# Patient Record
Sex: Male | Born: 1935 | Race: White | Hispanic: No | Marital: Married
Health system: Southern US, Community
[De-identification: ages and names within clinical notes are randomized; demographics above are authoritative.]

---

## 2014-01-11 ENCOUNTER — Observation Stay: Payer: Self-pay | Admitting: Internal Medicine

## 2014-01-11 LAB — COMPREHENSIVE METABOLIC PANEL
ALBUMIN: 3.4 g/dL (ref 3.4–5.0)
ANION GAP: 6 — AB (ref 7–16)
AST: 23 U/L (ref 15–37)
Alkaline Phosphatase: 78 U/L
BUN: 24 mg/dL — ABNORMAL HIGH (ref 7–18)
Bilirubin,Total: 0.3 mg/dL (ref 0.2–1.0)
CALCIUM: 8.6 mg/dL (ref 8.5–10.1)
Chloride: 104 mmol/L (ref 98–107)
Co2: 29 mmol/L (ref 21–32)
Creatinine: 1.33 mg/dL — ABNORMAL HIGH (ref 0.60–1.30)
EGFR (African American): 56 — ABNORMAL LOW
EGFR (Non-African Amer.): 48 — ABNORMAL LOW
Glucose: 152 mg/dL — ABNORMAL HIGH (ref 65–99)
Osmolality: 285 (ref 275–301)
Potassium: 4.9 mmol/L (ref 3.5–5.1)
SGPT (ALT): 14 U/L
Sodium: 139 mmol/L (ref 136–145)
TOTAL PROTEIN: 7.4 g/dL (ref 6.4–8.2)

## 2014-01-11 LAB — CBC
HCT: 37.1 % — ABNORMAL LOW (ref 40.0–52.0)
HGB: 12.2 g/dL — AB (ref 13.0–18.0)
MCH: 33.8 pg (ref 26.0–34.0)
MCHC: 32.9 g/dL (ref 32.0–36.0)
MCV: 103 fL — AB (ref 80–100)
PLATELETS: 192 10*3/uL (ref 150–440)
RBC: 3.61 10*6/uL — ABNORMAL LOW (ref 4.40–5.90)
RDW: 13 % (ref 11.5–14.5)
WBC: 7.7 10*3/uL (ref 3.8–10.6)

## 2014-01-11 LAB — TROPONIN I: Troponin-I: <0.02 ng/mL

## 2014-01-12 LAB — BASIC METABOLIC PANEL
Anion Gap: 5 — ABNORMAL LOW (ref 7–16)
BUN: 22 mg/dL — ABNORMAL HIGH (ref 7–18)
CALCIUM: 8.6 mg/dL (ref 8.5–10.1)
Chloride: 104 mmol/L (ref 98–107)
Co2: 31 mmol/L (ref 21–32)
Creatinine: 1.18 mg/dL (ref 0.60–1.30)
EGFR (African American): >60
EGFR (Non-African Amer.): 56 — ABNORMAL LOW
GLUCOSE: 92 mg/dL (ref 65–99)
Osmolality: 282 (ref 275–301)
POTASSIUM: 4.5 mmol/L (ref 3.5–5.1)
SODIUM: 140 mmol/L (ref 136–145)

## 2014-01-12 LAB — CK TOTAL AND CKMB (NOT AT ARMC)
CK, TOTAL: 58 U/L
CK, Total: 56 U/L
CK, Total: 67 U/L
CK-MB: 1.6 ng/mL (ref 0.5–3.6)
CK-MB: 1.3 ng/mL (ref 0.5–3.6)
CK-MB: 1.2 ng/mL (ref 0.5–3.6)

## 2014-01-12 LAB — URINALYSIS, COMPLETE
BILIRUBIN, UR: NEGATIVE
Bacteria: NONE SEEN
Blood: NEGATIVE
GLUCOSE, UR: NEGATIVE mg/dL (ref 0–75)
KETONE: NEGATIVE
LEUKOCYTE ESTERASE: NEGATIVE
Nitrite: NEGATIVE
Ph: 7 (ref 4.5–8.0)
Protein: NEGATIVE
SPECIFIC GRAVITY: 1.012 (ref 1.003–1.030)
Squamous Epithelial: NONE SEEN

## 2014-01-12 LAB — TROPONIN I: Troponin-I: <0.02 ng/mL

## 2014-09-15 NOTE — Discharge Summary (Signed)
PATIENT NAME:  Leslie Lee, Leslie Lee MR#:  [id number] DATE OF BIRTH:  Nov 06, 1925  DATE OF ADMISSION:  01/11/2014 DATE OF DISCHARGE:  01/12/2014  ADMITTING PHYSICIAN: Leslie Becton, MD  DISCHARGING PHYSICIAN: Leslie Lighter, MD  PRIMARY CARE PHYSICIAN: Leslie Drilling, MD ([facility])  [place]: None.   DISCHARGE DIAGNOSES: 1.  Chest pain, likely reflux.  2.  Coronary artery disease, status post stent.  3.  Pulmonary fibrosis.   DISCHARGE MEDICATIONS: 1.  Vitamin B12 1,000 mcg p.o. daily.  2.  Aspirin 81 mg p.o. daily.  3.  Pravastatin 40 mg daily p.o. at bedtime.   DISCHARGE DIET: Low-sodium diet.   DISCHARGE ACTIVITY: As tolerated.   FOLLOWUP INSTRUCTIONS: PCP follow-up in 1 to 2 weeks.   LABORATORIES AND IMAGING STUDIES PRIOR TO DISCHARGE: Urinalysis negative for any infection. Sodium 140, potassium 4.5, chloride 104, bicarb 31, BUN 22, creatinine 1.1, glucose 92, calcium 8.6. Troponins remained negative x3.   WBC is 7.7, hemoglobin 4.2, hematocrit 37.1, platelet count 192,000. ALT 14, AST 23, alk phos 78, total bili 0.3, albumin 3.4.   Chest x-ray on admission showing severe chronic lung disease and pleuritic calcifications without any acute findings.   He actually had a exercise stress test. Exercise portion of the test went well. Final results are pending at this time.   BRIEF HOSPITAL COURSE: Leslie Lee is an 81 year old pleasant Caucasian gentleman with past medical history significant for coronary artery disease status post stent at [place] about 5 years ago, history of pulmonary fibrosis, not on any home oxygen, presented to the hospital secondary to an episode of chest pain.   Chest pain: Started last night. If felt to be gas initially in the chest and the patient experienced some tightness in the chest associated with diaphoresis and presented to the hospital. His EKG did not show any acute changes. He was monitored on telemetry. His 3 sets of  troponins were negative. The patient underwent an exercise stress test. Preliminary report is still pending at this time, but exercise portion of the test was fine. If the test comes back negative, the patient will be discharged home. He is already on aspirin and statin at home. His blood pressure and heart rate are low normal, which he states is his baseline so he is not on any beta blockers or any other blood pressure medications. No changes will be made to his medications if his stress test is negative, and he is advised to follow up with his cardiologist as an outpatient. His course has been otherwise uneventful in the hospital.   DISCHARGE CONDITION: Stable.   DISCHARGE DISPOSITION: Home.   TIME SPENT ON DISCHARGE: 40 minutes. ____________________________ Leslie Lighter, MD rk:sb D: 01/12/2014 13:46:25 ET T: 01/12/2014 15:08:21 ET JOB#: [id number]  cc: Leslie Lighter, MD, <Dictator> Leslie Castle, MD Leslie Lighter MD ELECTRONICALLY SIGNED 01/23/2014 14:29

## 2014-09-15 NOTE — H&P (Signed)
PATIENT NAME:  Leslie Lee, Leslie Lee MR#:  [id number] DATE OF BIRTH:  Jun 17, 1925  DATE OF ADMISSION:  01/11/2014  PRIMARY CARE PHYSICIAN: Dr. Rolin Barry.   REFERRING PHYSICIAN: Dr. Derrill Kay.   CHIEF COMPLAINT: Chest pain.   HISTORY OF PRESENT ILLNESS: Leslie Lee is an 81 year old pleasant white male with past medical history of coronary artery disease, status post stent placement about 5 years back at [facility]. He comes to the Emergency Department with complaints of chest pain. The patient around 7:30 in the evening the patient was sitting and started to experience indigestion. He tried to stand up and started to feel severe dizziness, lightheadedness. The patient's wife held him and he continues to have severe lightheadedness, worsening chest tightness. Concerning this, the patient took one nitroglycerin did not help. Took 2 additional nitroglycerin with improvement of his symptoms. The patient states similar symptoms happened 5 years back when the patient required a stent placement. Work-up in the Emergency Department with EKG and cardiac enzymes were unremarkable. At baseline patient walks significant distance without having any chest pain, shortness of breath. The patient also had some diagnosis of pulmonary fibrosis and follows at [facility].   PAST MEDICAL HISTORY: Pulmonary fibrosis, coronary artery disease status post stent placement.   PAST SURGICAL HISTORY: Hernia repair.   ALLERGIES: No known drug allergies.    HOME MEDICATIONS:  1. Vitamin B12 1000 mcg once a day.  2. Pravastatin 40 mg once a day. 3. Aspirin 81 mg once a day.   SOCIAL HISTORY: No history of smoking, drinking alcohol or using illicit drugs. Married and lives with his wife. Independent of activities of daily living and IADLs.   FAMILY HISTORY: Both parents died at the age of 76 from old age.   REVIEW OF SYSTEMS:  CONSTITUTIONAL: Denies any generalized weakness.  EYES: No change in vision.  EARS, NOSE AND THROAT: No change  in hearing.  RESPIRATORY: Has shortness of breath with walking.  CARDIOVASCULAR: Experienced chest pain.  GASTROINTESTINAL: Experienced some indigestion. No episodes of vomiting.  GENITOURINARY: No dysuria or hematuria.  SKIN: No rash or lesions.  MUSCULOSKELETAL: No joint pains and aches.  NEUROLOGICAL: No weakness or numbness in any part of the body.   PHYSICAL EXAMINATION:  GENERAL: This is a well-built, well-nourished male who looks much younger than his stated age lying down in the bed, not in distress.  VITAL SIGNS: Temperature 97.5, pulse 68, blood pressure 116/80, respiratory rate of 18, oxygen saturation is 96% on room air.  HEENT: Head normocephalic, atraumatic. Eyes, no scleral icterus. Conjunctivae normal. Pupils equal, round and react to light. Mucous membranes moist. Extraocular movements are intact. No pharyngeal erythema.  NECK: Supple. No lymphadenopathy. No JVD. No carotid bruit.  CHEST: Has no focal tenderness.  LUNGS: Bilaterally clear to auscultation.  HEART: S1, S2 regular. No murmurs are heard.  ABDOMEN: Bowel sounds present. Soft, nontender, nondistended. No hepatosplenomegaly.  EXTREMITIES: No pedal edema. Pulses 2+.  NEUROLOGIC: The patient is alert, oriented to place, person, and time. Cranial nerves II through XII intact. Motor 5/5 in upper and lower extremities.   LABORATORY STUDIES: Complete metabolic panel: BUN of 24, creatinine of 1.33. The rest of the values are within normal limits. CBC: Hemoglobin 12.2.  MCV of 103. The rest of all the values are within normal limits. Chest x-ray, 1 view portable, severe chronic lung disease and pleural calcifications without definite acute underlying pulmonary findings. There is tortuosity and calcification of the thoracic aorta.   EKG, 12-lead: Normal  sinus rhythm with no ST-T wave abnormalities.   ASSESSMENT AND PLAN: Leslie Lee is an 81 year old male who comes to the Emergency Department with complaints of chest pain.    1. Chest pain. The patient had a stent placed 5 years back. The pain is quite similar to the previous myocardial infarction. The patient is admitted to a monitored bed. Continue to cycle cardiac enzymes x 3. If cardiac enzymes are negative, will obtain stress test in the morning. The patient states can walk on the treadmill.  2. Pulmonary fibrosis. Currently not on any medications.  3. History of coronary artery disease, status post myocardial infarction. Continue aspirin and statin.  4. Keep the patient on deep vein thrombosis prophylaxis with Lovenox.   TIME SPENT: 50 minutes.    ____________________________ Susa GriffinsPadmaja Cambrie Sonnenfeld, MD pv:JT D: 01/12/2014 00:57:04 ET T: 01/12/2014 01:27:49 ET JOB#: [id number]  cc: Susa GriffinsPadmaja Khyli Swaim, MD, <Dictator> Susa GriffinsPADMAJA Galena Logie MD ELECTRONICALLY SIGNED 01/13/2014 0:51

## 2014-12-24 DEATH — deceased

## 2016-06-10 IMAGING — NM CT OUTSIDE FILMS CHEST
8 series · 48 of 48 positions shown · non-contrast
Comparison: none

[Series 1000: stress-gated_dc · 4.80mm/px · 6 of 544 frames shown]
[frame 46/544]
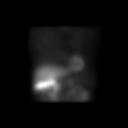
[frame 136/544]
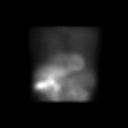
[frame 227/544]
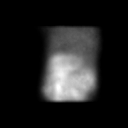
[frame 318/544]
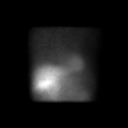
[frame 408/544]
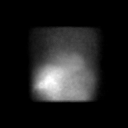
[frame 499/544]
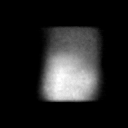

[Series 1000: stress (recon - noac ) · 4.8mm · 4.80mm/px · 6 of 52 frames shown]
[frame 5/52]
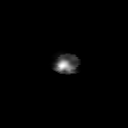
[frame 13/52]
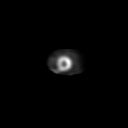
[frame 22/52]
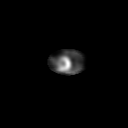
[frame 31/52]
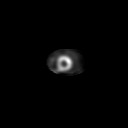
[frame 39/52]
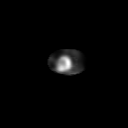
[frame 48/52]
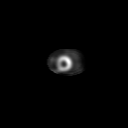

[Series 1000: stress (recon - ac ) · 4.8mm · 4.80mm/px · 6 of 52 frames shown]
[frame 5/52]
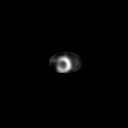
[frame 13/52]
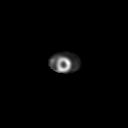
[frame 22/52]
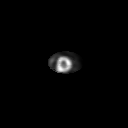
[frame 31/52]
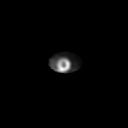
[frame 39/52]
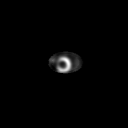
[frame 48/52]
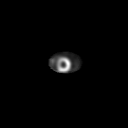

[Series 1000: stress_dc · 4.80mm/px · 6 of 68 frames shown]
[frame 6/68]
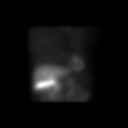
[frame 17/68]
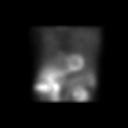
[frame 29/68]
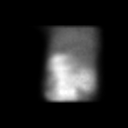
[frame 40/68]
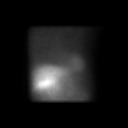
[frame 51/68]
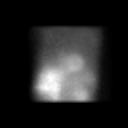
[frame 63/68]
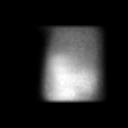

[Series 1000: rest_dc · 4.80mm/px · 6 of 68 frames shown]
[frame 6/68]
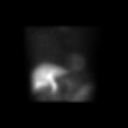
[frame 17/68]
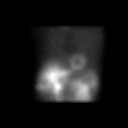
[frame 29/68]
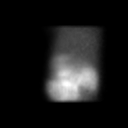
[frame 40/68]
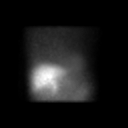
[frame 51/68]
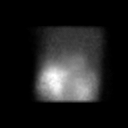
[frame 63/68]
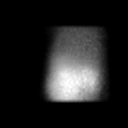

[Series 1000: stress-gated (recon) · 4.8mm · 4.80mm/px · 6 of 416 frames shown]
[frame 35/416]
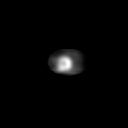
[frame 104/416]
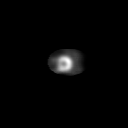
[frame 174/416]
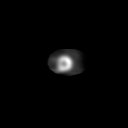
[frame 243/416]
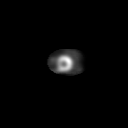
[frame 312/416]
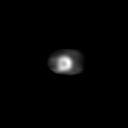
[frame 382/416]
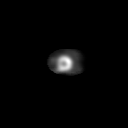

[Series 1000: rest (recon - ac ) · 4.8mm · 4.80mm/px · 6 of 52 frames shown]
[frame 5/52]
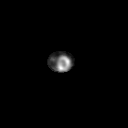
[frame 13/52]
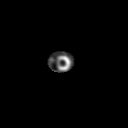
[frame 22/52]
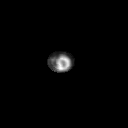
[frame 31/52]
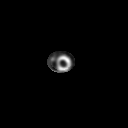
[frame 39/52]
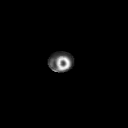
[frame 48/52]
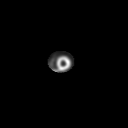

[Series 1000: rest (recon - noac ) · 4.8mm · 4.80mm/px · 6 of 52 frames shown]
[frame 5/52]
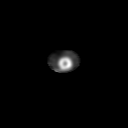
[frame 13/52]
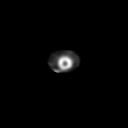
[frame 22/52]
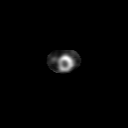
[frame 31/52]
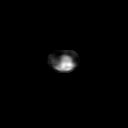
[frame 39/52]
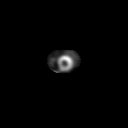
[frame 48/52]
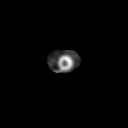

[48 of 48 positions shown; findings below may reference images not displayed]

**** An original report or order could not be provided from the [HOSPITAL] [facility] ****
